# Patient Record
Sex: Male | Born: 1990 | Race: White | Hispanic: No | Marital: Married | Smoking: Former smoker
Health system: Southern US, Community
[De-identification: ages and names within clinical notes are randomized; demographics above are authoritative.]

## PROBLEM LIST (undated history)

## (undated) DIAGNOSIS — F419 Anxiety disorder, unspecified: Secondary | ICD-10-CM

## (undated) HISTORY — DX: Anxiety disorder, unspecified: F41.9

---
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
## 2011-07-12 IMAGING — CT CT ABDOMEN W/ CM
2 of 4 series · 17 of 46 positions shown, 19 images · IV contrast (APPLIED)
Comparison: None.

CT ABDOMEN

CLINICAL DATA: Right lower quadrant pain.  Normal white count

CT ABDOMEN AND PELVIS WITH CONTRAST
TECHNIQUE: Multidetector CT imaging of the abdomen and pelvis was
performed using the standard protocol during bolus administration
of intravenous contrast.

[Series 2: abd/pelv with 5.0 b31f st · axial · 0.85mm/px · z∈[-706,-272]mm · 14 of 97 slices shown, 16 images]
[im 5/97  soft-tissue]
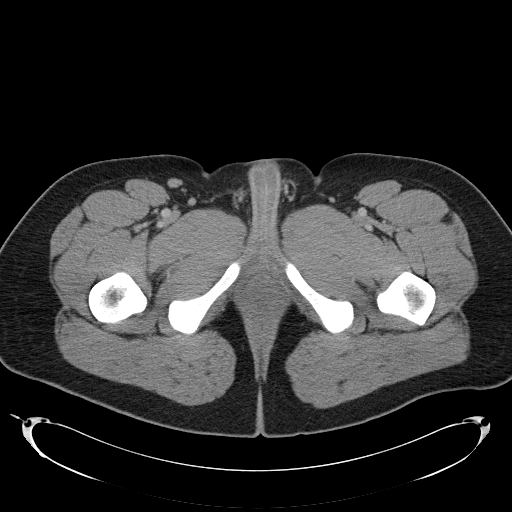
[im 5/97  bone]
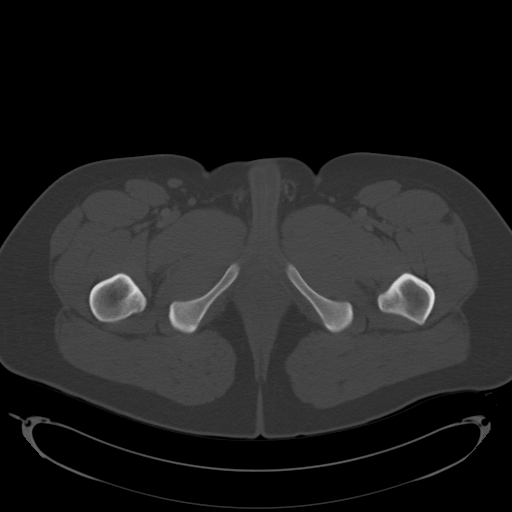
[im 13/97  soft-tissue]
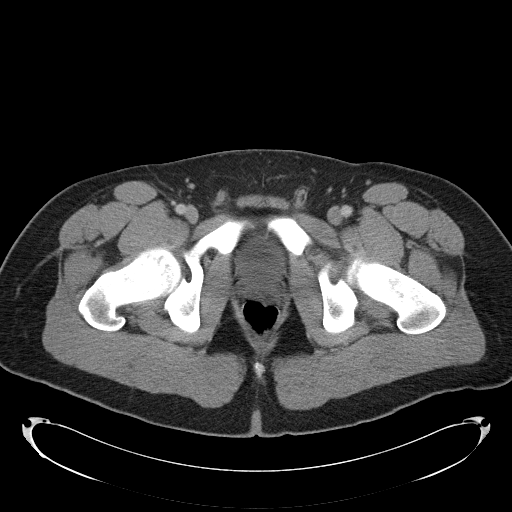
[im 17/97  soft-tissue]
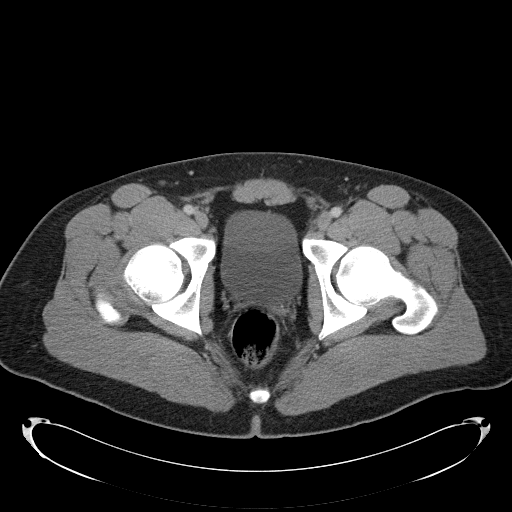
[im 26/97  soft-tissue]
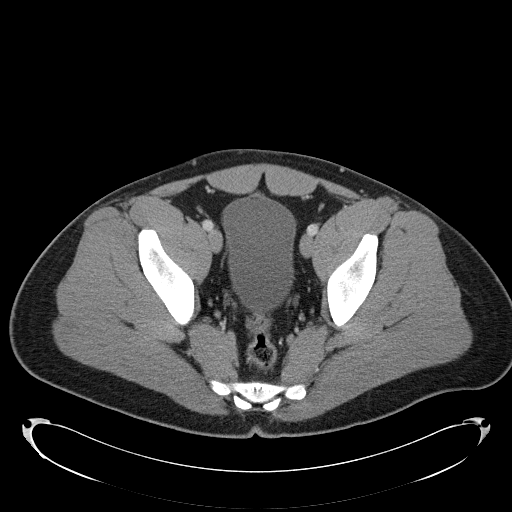
[im 34/97  soft-tissue]
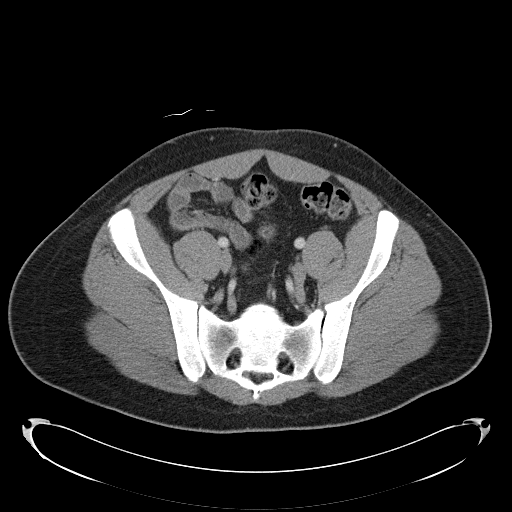
[im 38/97  soft-tissue]
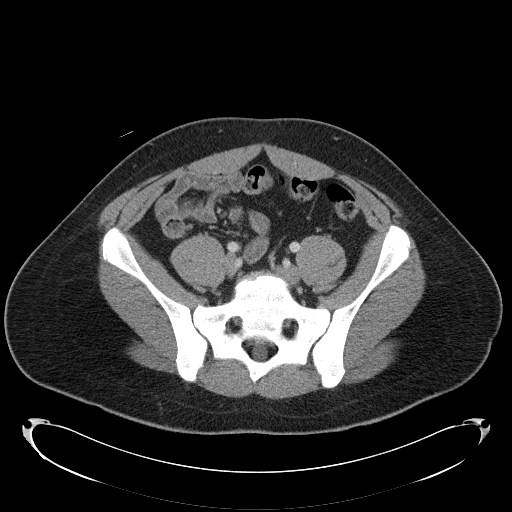
[im 46/97  soft-tissue]
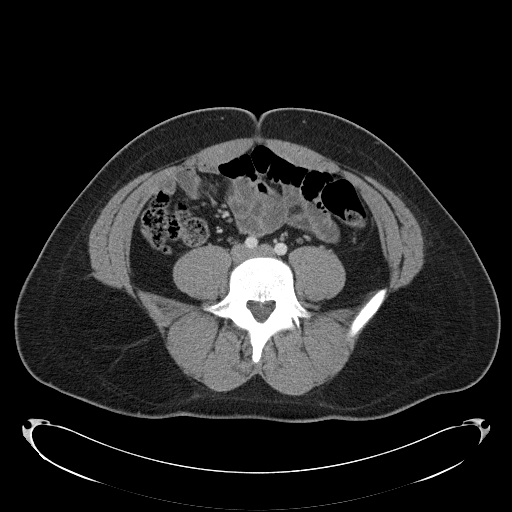
[im 51/97  soft-tissue]
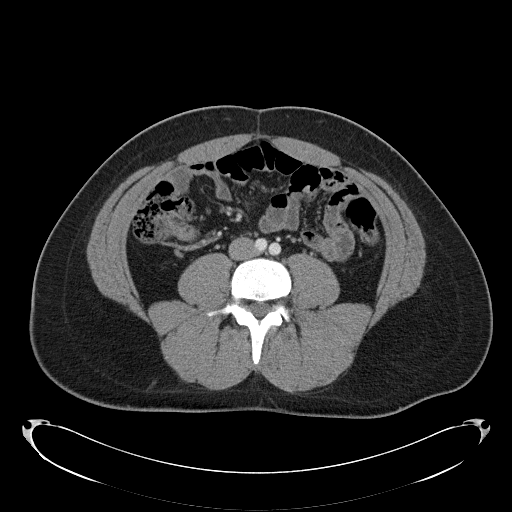
[im 59/97  soft-tissue]
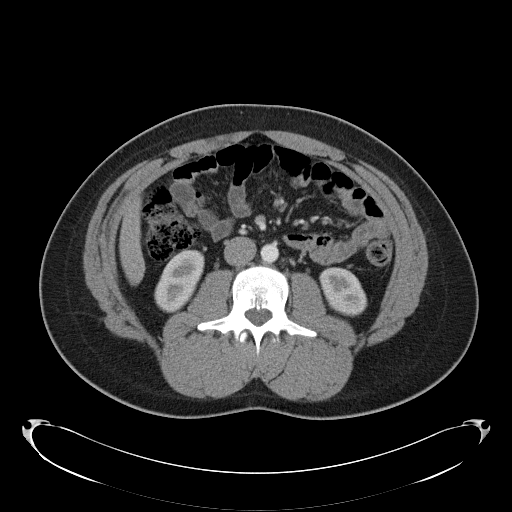
[im 59/97  bone]
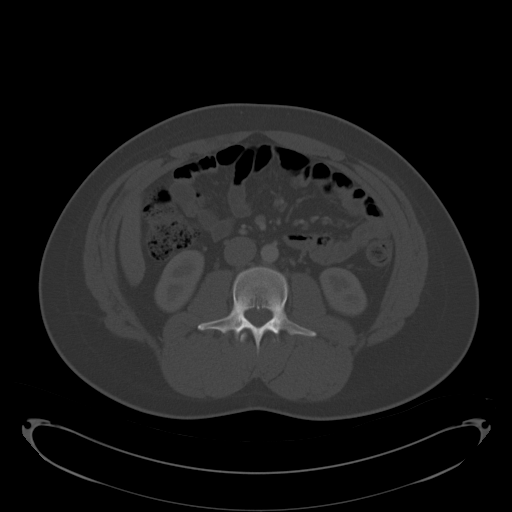
[im 63/97  soft-tissue]
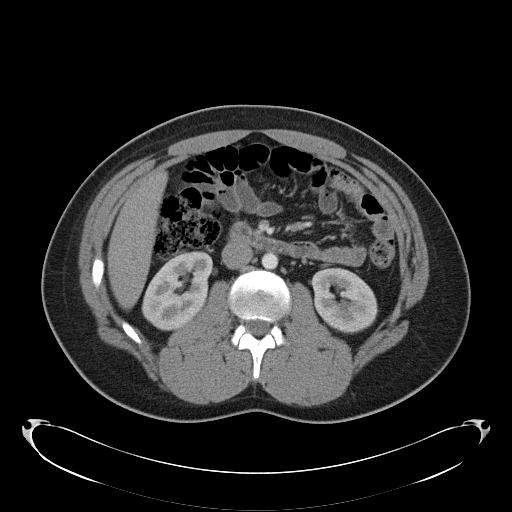
[im 71/97  soft-tissue]
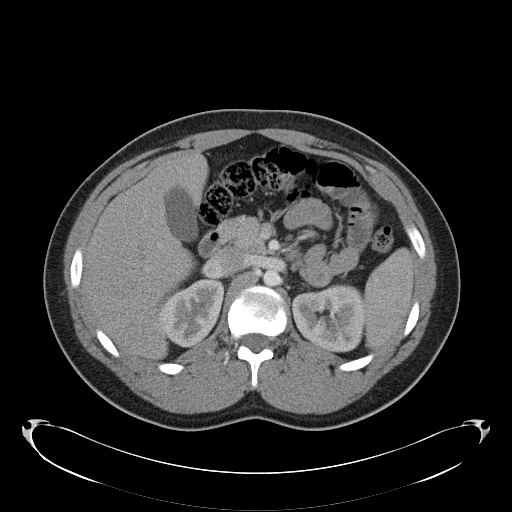
[im 80/97  soft-tissue]
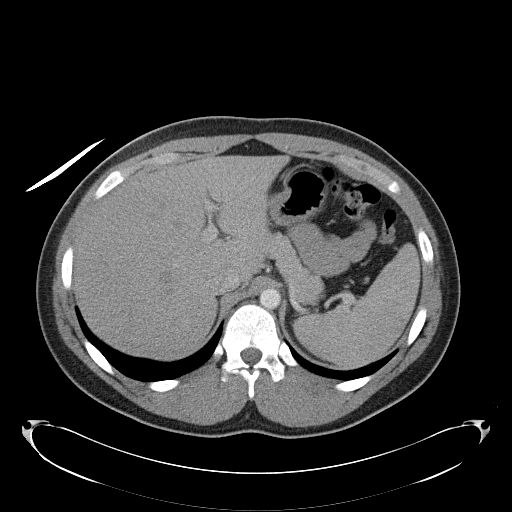
[im 84/97  soft-tissue]
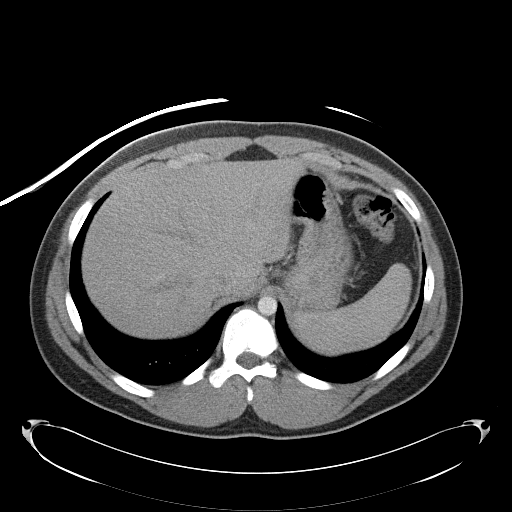
[im 92/97  soft-tissue]
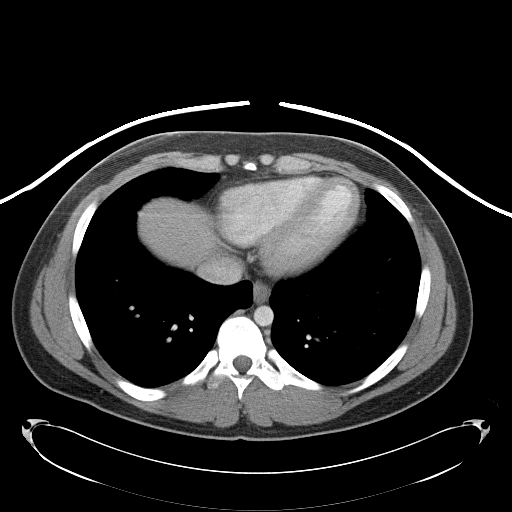

[Series 5: abd/pelv with 3.0 spo cor st · coronal · 0.95mm/px · 3 of 74 slices shown]
[im 25/74  soft-tissue]
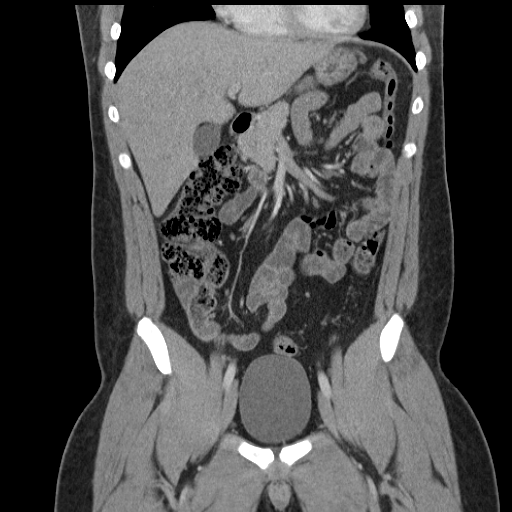
[im 33/74  soft-tissue]
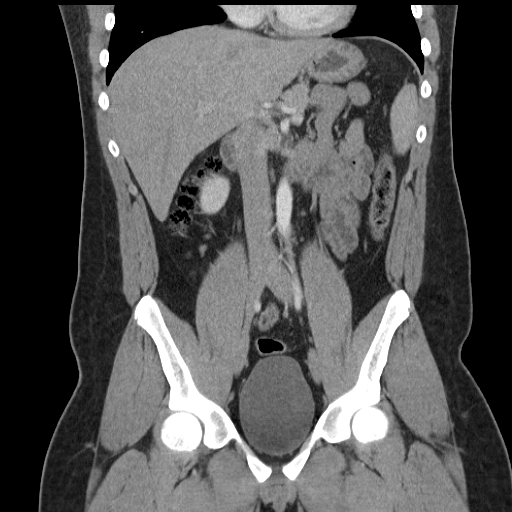
[im 41/74  soft-tissue]
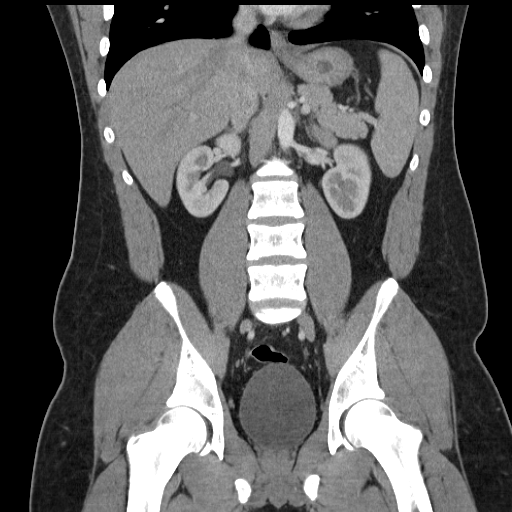

[17 of 46 positions shown; findings below may reference images not displayed]

FINDINGS: Liver, spleen, pancreas, adrenal glands, and kidneys
normal. Gallbladder unremarkable by CT. No biliary ductal dilation.
Stomach and visualized large and small bowel unremarkable.
Abdominal aorta normal in caliber. No significant lymphadenopathy.
No free fluid. Visualized lung bases clear. Moderate stool burden.
IMPRESSION: Normal CT of the abdomen.

CT PELVIS
FINDINGS: Appendix identified and normal. Visualized colon and
small bowel unremarkable. No free fluid. Pelvic organs normal for
age.  No significant lymphadenopathy. Urinary bladder normal.
Moderate stool burden.
IMPRESSION: Moderate stool burden, otherwise negative CT of the pelvis.  No
evidence for appendicitis.

## 2011-11-21 ENCOUNTER — Emergency Department (HOSPITAL_COMMUNITY)
Admission: EM | Admit: 2011-11-21 | Discharge: 2011-11-22 | Disposition: A | Discharge status: Home / Self Care | Payer: Managed Care, Other (non HMO) | Attending: Emergency Medicine | Admitting: Emergency Medicine

## 2011-11-21 ENCOUNTER — Encounter (HOSPITAL_COMMUNITY): Payer: Self-pay | Admitting: *Deleted

## 2011-11-21 DIAGNOSIS — Z87891 Personal history of nicotine dependence: Secondary | ICD-10-CM | POA: Insufficient documentation

## 2011-11-21 DIAGNOSIS — L03113 Cellulitis of right upper limb: Secondary | ICD-10-CM

## 2011-11-21 DIAGNOSIS — IMO0002 Reserved for concepts with insufficient information to code with codable children: Secondary | ICD-10-CM | POA: Insufficient documentation

## 2011-11-21 LAB — CBC WITH DIFFERENTIAL/PLATELET
Basophils Absolute: 0 10*3/uL (ref 0.0–0.1)
Eosinophils Absolute: 0.3 10*3/uL (ref 0.0–0.7)
Lymphs Abs: 2.7 10*3/uL (ref 0.7–4.0)
MCH: 29.4 pg (ref 26.0–34.0)
Neutrophils Relative %: 38 % — ABNORMAL LOW (ref 43–77)
Platelets: 202 10*3/uL (ref 150–400)
RBC: 4.89 MIL/uL (ref 4.22–5.81)
RDW: 12.8 % (ref 11.5–15.5)
WBC: 5.8 10*3/uL (ref 4.0–10.5)

## 2011-11-21 LAB — BASIC METABOLIC PANEL
Calcium: 9.5 mg/dL (ref 8.4–10.5)
GFR calc Af Amer: >90 mL/min (ref >90)
GFR calc non Af Amer: 90 mL/min — ABNORMAL LOW (ref >90)
Glucose, Bld: 91 mg/dL (ref 70–99)
Potassium: 4 mEq/L (ref 3.5–5.1)
Sodium: 138 mEq/L (ref 135–145)

## 2011-11-21 MED ORDER — CLINDAMYCIN PHOSPHATE 900 MG/50ML IV SOLN
900.0000 mg | Freq: Once | INTRAVENOUS | Status: AC
  Administered 2011-11-21: 900 mg via INTRAVENOUS
  Filled 2011-11-21: qty 50

## 2011-11-21 MED ORDER — SODIUM CHLORIDE 0.9 % IV BOLUS (SEPSIS)
1000.0000 mL | Freq: Once | INTRAVENOUS | Status: AC
  Administered 2011-11-21: 1000 mL via INTRAVENOUS

## 2011-11-21 NOTE — ED Notes (Signed)
Peter, PA at bedside.

## 2011-11-21 NOTE — ED Notes (Signed)
Pt presents with an infection to his right forearm. Pt states [employer] doctor dx it as a staph infection. Pt was put on abx. Pt is having no relief, and reports infection is spreading. Pt reports n/d. Denies nausea

## 2011-11-21 NOTE — ED Provider Notes (Signed)
History     CSN: [id number]  Arrival date & time 11/21/11  1825   First MD Initiated Contact with Patient 11/21/11 2250      Chief Complaint  Patient presents with  . Cellulitis   HPI  History provided by the patient. Patient is a 21 year old male with no significant PMH who presents with concerns of skin infection to right forearm the past several days. 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 Patient was told he had a staph infection and placed on Bactrim. He has been taking this for the past few days but reports having increasing redness and now increased tenderness to the skin area. Patient also reports feeling increased fatigue and weakness with decreased appetite. He denies having any fever, chills or sweats. Denies any episodes of vomiting but does report several loose bowel movements. He denies any abdominal pain.     History reviewed. No pertinent past medical history.  History reviewed. No pertinent past surgical history.  History reviewed. No pertinent family history.  History  Substance Use Topics  . Smoking status: Former Games developer  . Smokeless tobacco: Current User    Types: Chew  . Alcohol Use: Yes      Review of Systems  Constitutional: Positive for appetite change, fatigue and unexpected weight change. Negative for fever and chills.  Respiratory: Negative for cough.   Gastrointestinal: Positive for nausea and diarrhea. Negative for vomiting and abdominal pain.  Neurological: Negative for headaches.    Allergies  Review of patient's allergies indicates no known allergies.  Home Medications   Current Outpatient Rx  Name Route Sig Dispense Refill  . LISDEXAMFETAMINE DIMESYLATE 60 MG PO CAPS Oral Take 60 mg by mouth every morning.    . SULFAMETHOXAZOLE-TRIMETHOPRIM 800-160 MG PO TABS Oral Take 1 tablet by mouth 2 (two) times daily.      BP 130/77   Pulse 64  Temp 97.6 F (36.4 C) (Oral)  Resp 18  Ht 6\' 2"  (1.88 m)  Wt 272 lb 3.2 oz (123.469 kg)  BMI 34.95 kg/m2  SpO2 100%  Physical Exam  Nursing note and vitals reviewed. Constitutional: He appears well-developed and well-nourished. No distress.  HENT:  Head: Normocephalic.  Neck: Normal range of motion. Neck supple.       No meningeal signs  Cardiovascular: Normal rate and regular rhythm.   Pulmonary/Chest: Effort normal and breath sounds normal. No respiratory distress. He has no wheezes. He has no rales.  Musculoskeletal:       Arms:      3 distinct small ulcerative type lesions to anterior right forearm with surrounding erythema and mild induration. No erythematous streaks up the arm. There is mild tenderness.  Neurological: He is alert.  Skin: Skin is warm. No rash noted.       See extremity exam   Psychiatric: He has a normal mood and affect. His behavior is normal.    ED Course  Procedures   Results for orders placed during the hospital encounter of 11/21/11  CBC WITH DIFFERENTIAL      Component Value Range   WBC 5.8  4.0 - 10.5 K/uL   RBC 4.89  4.22 - 5.81 MIL/uL   Hemoglobin 14.4  13.0 - 17.0 g/dL   HCT 40.9  81.1 - 91.4 %   MCV 85.3  78.0 - 100.0 fL   MCH 29.4  26.0 -  34.0 pg   MCHC 34.5  30.0 - 36.0 g/dL   RDW 16.1  09.6 - 04.5 %   Platelets 202  150 - 400 K/uL   Neutrophils Relative 38 (*) 43 - 77 %   Neutro Abs 2.2  1.7 - 7.7 K/uL   Lymphocytes Relative 46  12 - 46 %   Lymphs Abs 2.7  0.7 - 4.0 K/uL   Monocytes Relative 11  3 - 12 %   Monocytes Absolute 0.6  0.1 - 1.0 K/uL   Eosinophils Relative 4  0 - 5 %   Eosinophils Absolute 0.3  0.0 - 0.7 K/uL   Basophils Relative 1  0 - 1 %   Basophils Absolute 0.0  0.0 - 0.1 K/uL  BASIC METABOLIC PANEL      Component Value Range   Sodium 138  135 - 145 mEq/L   Potassium 4.0  3.5 - 5.1 mEq/L   Chloride 102  96 - 112 mEq/L   CO2 26  19 - 32 mEq/L   Glucose, Bld 91  70 - 99 mg/dL   BUN 13  6 - 23 mg/dL    Creatinine, Ser 4.09  0.50 - 1.35 mg/dL   Calcium 9.5  8.4 - 81.1 mg/dL   GFR calc non Af Amer 90 (*) >90 mL/min   GFR calc Af Amer >90  >90 mL/min        1. Cellulitis of forearm, right       MDM  10:50 PM patient seen and evaluated. Patient well-appearing nontoxic.   Patient continues to do well. Skin marking apply to area of erythema. Patient given dose of clindamycin IV. At this time we'll discharged with prescription of same. He will followup with team doctor for continued evaluation and treatment.     Angus Seller, [place] 11/22/11 [phone]

## 2011-11-22 MED ORDER — CLINDAMYCIN HCL 300 MG PO CAPS: 300.0000 mg | ORAL_CAPSULE | Freq: Four times a day (QID) | ORAL | Status: DC

## 2011-11-22 MED ORDER — MUPIROCIN 2 % EX OINT: TOPICAL_OINTMENT | Freq: Three times a day (TID) | CUTANEOUS | Status: DC

## 2011-11-22 NOTE — ED Provider Notes (Signed)
Medical screening examination/treatment/procedure(s) were performed by non-physician practitioner and as supervising physician I was immediately available for consultation/collaboration.  Jones Skene, M.D.     Jones Skene, MD 11/22/11 [phone]

## 2012-05-19 ENCOUNTER — Emergency Department (HOSPITAL_COMMUNITY)
Admission: EM | Admit: 2012-05-19 | Discharge: 2012-05-19 | Disposition: A | Discharge status: Home / Self Care | Payer: Managed Care, Other (non HMO) | Attending: Emergency Medicine | Admitting: Emergency Medicine

## 2012-05-19 ENCOUNTER — Encounter (HOSPITAL_COMMUNITY): Payer: Self-pay | Admitting: Emergency Medicine

## 2012-05-19 ENCOUNTER — Emergency Department (HOSPITAL_COMMUNITY): Payer: Managed Care, Other (non HMO)

## 2012-05-19 DIAGNOSIS — R609 Edema, unspecified: Secondary | ICD-10-CM | POA: Insufficient documentation

## 2012-05-19 DIAGNOSIS — R0982 Postnasal drip: Secondary | ICD-10-CM | POA: Insufficient documentation

## 2012-05-19 DIAGNOSIS — R059 Cough, unspecified: Secondary | ICD-10-CM | POA: Insufficient documentation

## 2012-05-19 DIAGNOSIS — R110 Nausea: Secondary | ICD-10-CM | POA: Insufficient documentation

## 2012-05-19 DIAGNOSIS — R509 Fever, unspecified: Secondary | ICD-10-CM | POA: Insufficient documentation

## 2012-05-19 DIAGNOSIS — Z87891 Personal history of nicotine dependence: Secondary | ICD-10-CM | POA: Insufficient documentation

## 2012-05-19 DIAGNOSIS — J029 Acute pharyngitis, unspecified: Secondary | ICD-10-CM | POA: Insufficient documentation

## 2012-05-19 DIAGNOSIS — R52 Pain, unspecified: Secondary | ICD-10-CM | POA: Insufficient documentation

## 2012-05-19 DIAGNOSIS — J3489 Other specified disorders of nose and nasal sinuses: Secondary | ICD-10-CM | POA: Insufficient documentation

## 2012-05-19 DIAGNOSIS — Z79899 Other long term (current) drug therapy: Secondary | ICD-10-CM | POA: Insufficient documentation

## 2012-05-19 DIAGNOSIS — R0602 Shortness of breath: Secondary | ICD-10-CM | POA: Insufficient documentation

## 2012-05-19 DIAGNOSIS — J069 Acute upper respiratory infection, unspecified: Secondary | ICD-10-CM | POA: Insufficient documentation

## 2012-05-19 LAB — RAPID STREP SCREEN (MED CTR MEBANE ONLY): Streptococcus, Group A Screen (Direct): NEGATIVE

## 2012-05-19 MED ORDER — GUAIFENESIN ER 600 MG PO TB12: 1200.0000 mg | ORAL_TABLET | Freq: Two times a day (BID) | ORAL | Status: DC

## 2012-05-19 MED ORDER — ALBUTEROL SULFATE HFA 108 (90 BASE) MCG/ACT IN AERS
2.0000 | INHALATION_SPRAY | Freq: Four times a day (QID) | RESPIRATORY_TRACT | Status: DC | PRN
  Administered 2012-05-19: 2 via RESPIRATORY_TRACT
  Filled 2012-05-19: qty 6.7

## 2012-05-19 MED ORDER — IPRATROPIUM BROMIDE 0.03 % NA SOLN: 2.0000 | Freq: Two times a day (BID) | NASAL | Status: DC

## 2012-05-19 MED ORDER — HYDROCODONE-HOMATROPINE 5-1.5 MG/5ML PO SYRP: 5.0000 mL | ORAL_SOLUTION | Freq: Four times a day (QID) | ORAL | Status: DC | PRN

## 2012-05-19 NOTE — ED Notes (Signed)
Pt denies any questions upon discharge. 

## 2012-05-19 NOTE — ED Provider Notes (Signed)
History  This chart was scribed for non-physician practitioner Dierdre Forth, PA-C working with Vida Roller, MD, by Candelaria Stagers, ED Scribe. This patient was seen in room TR10C/TR10C and the patient's care was started at 10:36 PM   CSN: [id number]  Arrival date & time 05/19/12  2010   First MD Initiated Contact with Patient 05/19/12 2128      Chief Complaint  Patient presents with  . Cough    The history is provided by the patient. No language interpreter was used.   Alex Rivas is a 22 y.o. male who presents to the Emergency Department complaining of cough and sore throat that started about three weeks ago, subsided, and then worsened two days ago.  Pt is also experiencing SOB, fever, body aches, sinus pressure, and drainage down the back of his throat.  He denies vomiting or diarrhea.  Pt did have a flu shot this year.  Pt lives in a dorm.  He has taken Dayquil and Mucinex with no relief.    History reviewed. No pertinent past medical history.  History reviewed. No pertinent past surgical history.  No family history on file.  History  Substance Use Topics  . Smoking status: Former Games developer  . Smokeless tobacco: Current User    Types: Chew  . Alcohol Use: Yes      Review of Systems  Constitutional: Positive for fever. Negative for chills.  HENT: Positive for postnasal drip and sinus pressure.   Gastrointestinal: Positive for nausea. Negative for vomiting and diarrhea.  All other systems reviewed and are negative.    Allergies  Review of patient's allergies indicates no known allergies.  Home Medications   Current Outpatient Rx  Name  Route  Sig  Dispense  Refill  . guaiFENesin (MUCINEX) 600 MG 12 hr tablet   Oral   Take 2 tablets (1,200 mg total) by mouth 2 (two) times daily.   30 tablet   0   . HYDROcodone-homatropine (HYCODAN) 5-1.5 MG/5ML syrup   Oral   Take 5 mLs by mouth every 6 (six) hours as needed for cough.   120 mL   0   .  ipratropium (ATROVENT) 0.03 % nasal spray   Nasal   Place 2 sprays into the nose every 12 (twelve) hours.   30 mL   12   . lisdexamfetamine (VYVANSE) 60 MG capsule   Oral   Take 60 mg by mouth every morning.           BP 132/76  Pulse 102  Temp(Src) 98.3 F (36.8 C) (Oral)  Resp 22  SpO2 98%  Physical Exam  Nursing note and vitals reviewed. Constitutional: He is oriented to person, place, and time. He appears well-developed and well-nourished. No distress.  HENT:  Head: Normocephalic and atraumatic.  Right Ear: Tympanic membrane, external ear and ear canal normal. Tympanic membrane is not erythematous and not bulging.  Left Ear: Tympanic membrane, external ear and ear canal normal. Tympanic membrane is not erythematous and not bulging.  Nose: Mucosal edema and rhinorrhea present. Right sinus exhibits no maxillary sinus tenderness and no frontal sinus tenderness. Left sinus exhibits no maxillary sinus tenderness and no frontal sinus tenderness.  Mouth/Throat: Uvula is midline, oropharynx is clear and moist and mucous membranes are normal. Mucous membranes are not dry and not cyanotic. No edematous. No oropharyngeal exudate, posterior oropharyngeal edema, posterior oropharyngeal erythema or tonsillar abscesses.  Eyes: Conjunctivae are normal. Pupils are equal, round, and reactive to light. No  scleral icterus.  Neck: Normal range of motion. Neck supple.  Cardiovascular: Regular rhythm, normal heart sounds and intact distal pulses.  Exam reveals no gallop and no friction rub.   No murmur heard. Mildly tachycardic.   Pulmonary/Chest: Effort normal and breath sounds normal. No respiratory distress. He has no wheezes. He has no rales.  Abdominal: Soft. Bowel sounds are normal. He exhibits no mass. There is no tenderness. There is no rebound and no guarding.  Musculoskeletal: Normal range of motion. He exhibits no edema.  Lymphadenopathy:    He has no cervical adenopathy.   Neurological: He is alert and oriented to person, place, and time. He exhibits normal muscle tone. Coordination normal.  Speech is clear and goal oriented Moves extremities without ataxia  Skin: Skin is warm and dry. No rash noted. He is not diaphoretic. No erythema.  Psychiatric: He has a normal mood and affect.    ED Course  Procedures   DIAGNOSTIC STUDIES: Oxygen Saturation is 98% on room air, normal by my interpretation.    COORDINATION OF CARE:  10:38 PM Discussed course of care with pt which includes treating symptoms.  Pt understands and agrees.    Labs Reviewed  RAPID STREP SCREEN   Dg Chest 2 View  05/19/2012  *RADIOLOGY REPORT*  Clinical Data: Congestion and shortness of breath for a month. Increasing over 2 days.  CHEST - 2 VIEW  Comparison: None.  Findings: Shallow inspiration. The heart size and pulmonary vascularity are normal. The lungs appear clear and expanded without focal air space disease or consolidation. No blunting of the costophrenic angles.  No pneumothorax.  Mediastinal contours appear intact.  IMPRESSION: No evidence of active pulmonary disease.   Original Report Authenticated By: Burman Nieves, M.D.      1. Viral URI with cough       MDM  Alex Rivas presents for URI symptoms.  Pt CXR negative for acute infiltrate. Patients symptoms are consistent with URI, likely viral etiology. Discussed that antibiotics are not indicated for viral infections. Pt will be discharged with symptomatic treatment.  Verbalizes understanding and is agreeable with plan. Pt is hemodynamically stable & in NAD prior to dc.  1. Medications: atrovent NS, mucinex, hycodan, usual home medications 2. Treatment: rest, drink plenty of fluids, take tylenol or ibuprofen for fever control 3. Follow Up: Please followup with your primary doctor for discussion of your diagnoses and further evaluation after today's visit; if you do not have a primary care doctor use the resource guide  provided to find one;   I personally performed the services described in this documentation, which was scribed in my presence. The recorded information has been reviewed and is accurate.   Dahlia Client Polo Mcmartin, PA-C 05/19/12 2306  Dierdre Forth, PA-C 05/19/12 2306

## 2012-05-19 NOTE — ED Notes (Signed)
Pt st's he had a cough 3 weeks ago then it got better st's cough returned 2 days ago with sore throat

## 2012-05-19 NOTE — ED Provider Notes (Signed)
Medical screening examination/treatment/procedure(s) were performed by non-physician practitioner and as supervising physician I was immediately available for consultation/collaboration.    Mahoganie Basher D Deanna Wiater, MD 05/19/12 2349 

## 2014-11-30 IMAGING — CR DG CHEST 2V
2 series · 2 of 2 positions shown · non-contrast
Comparison: None.

CLINICAL DATA: Congestion and shortness of breath for a month.
Increasing over 2 days.

CHEST - 2 VIEW

[w chest pa]
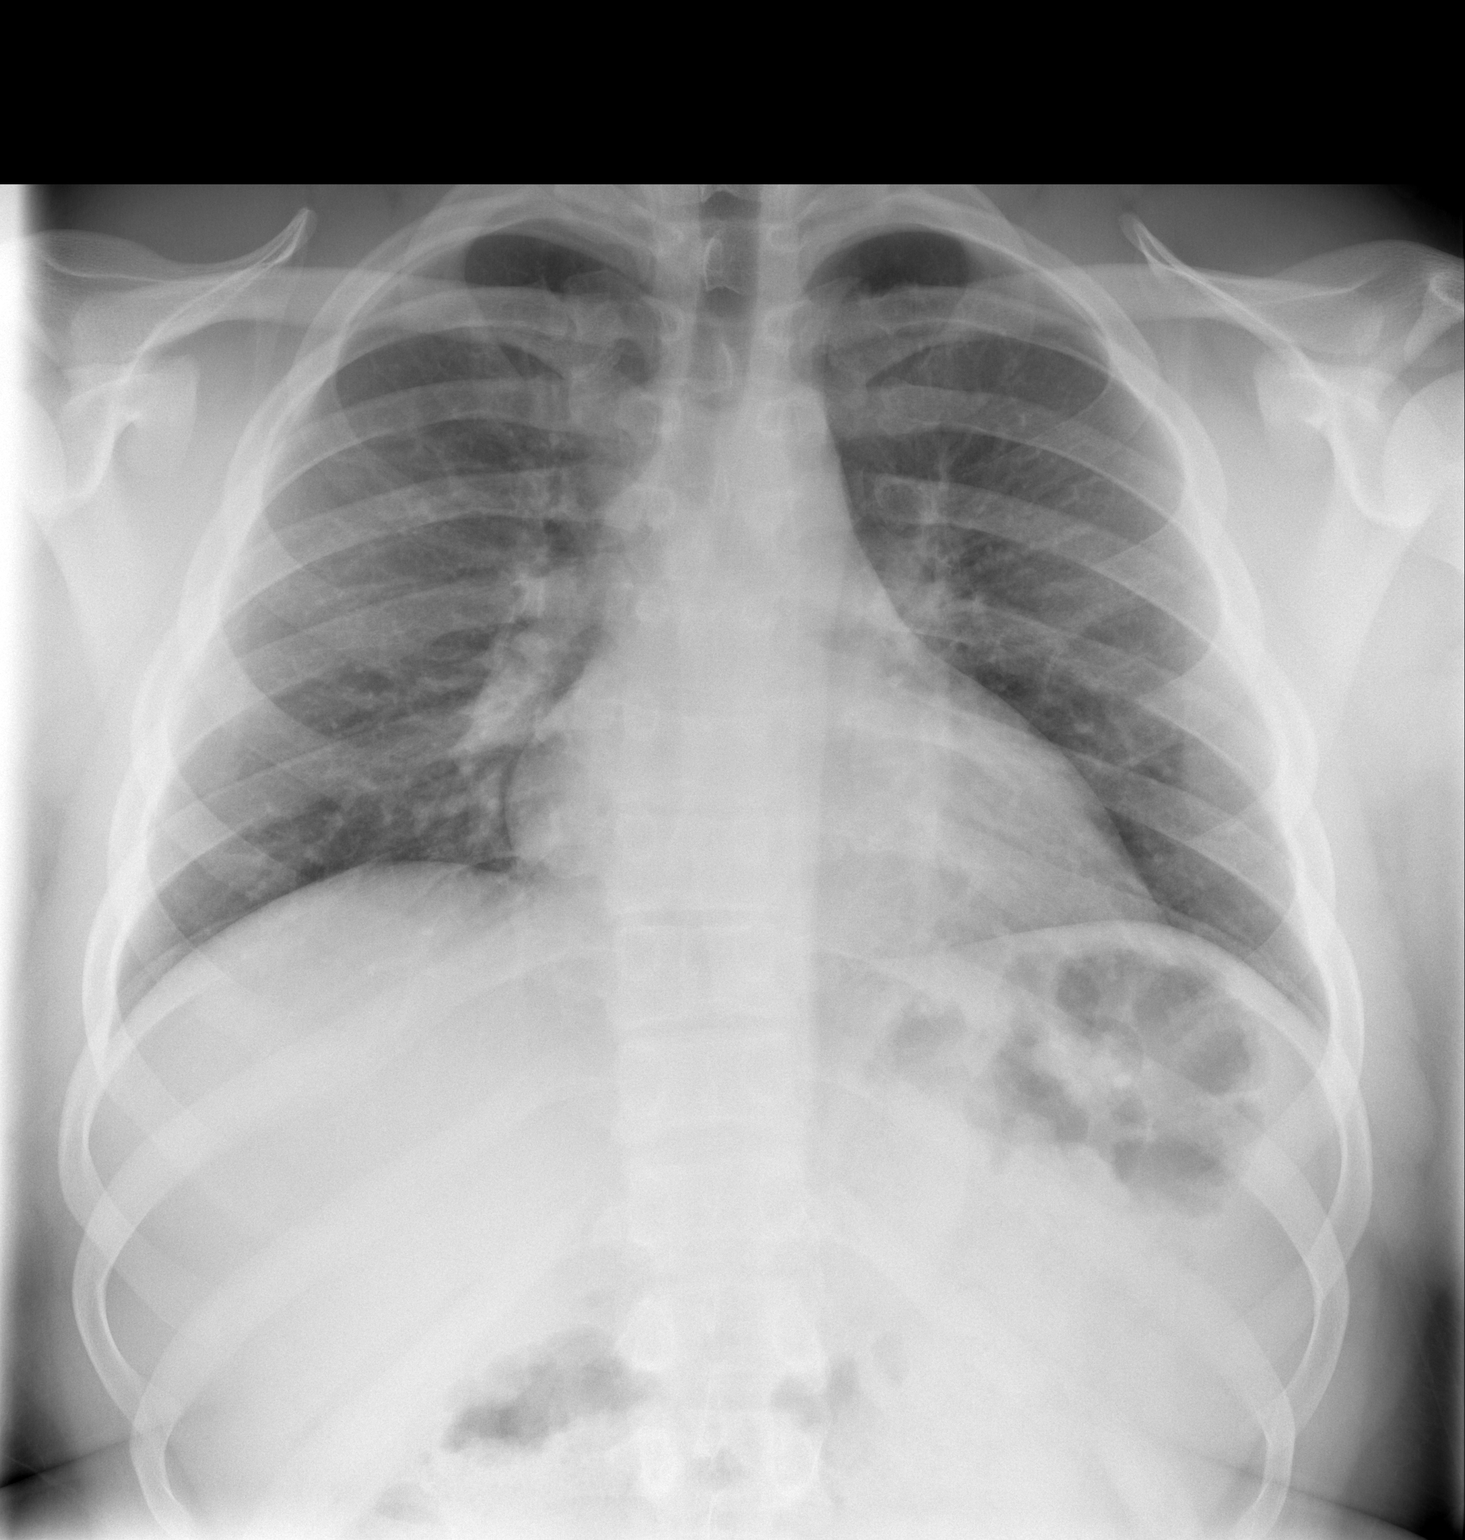

[w chest lat]
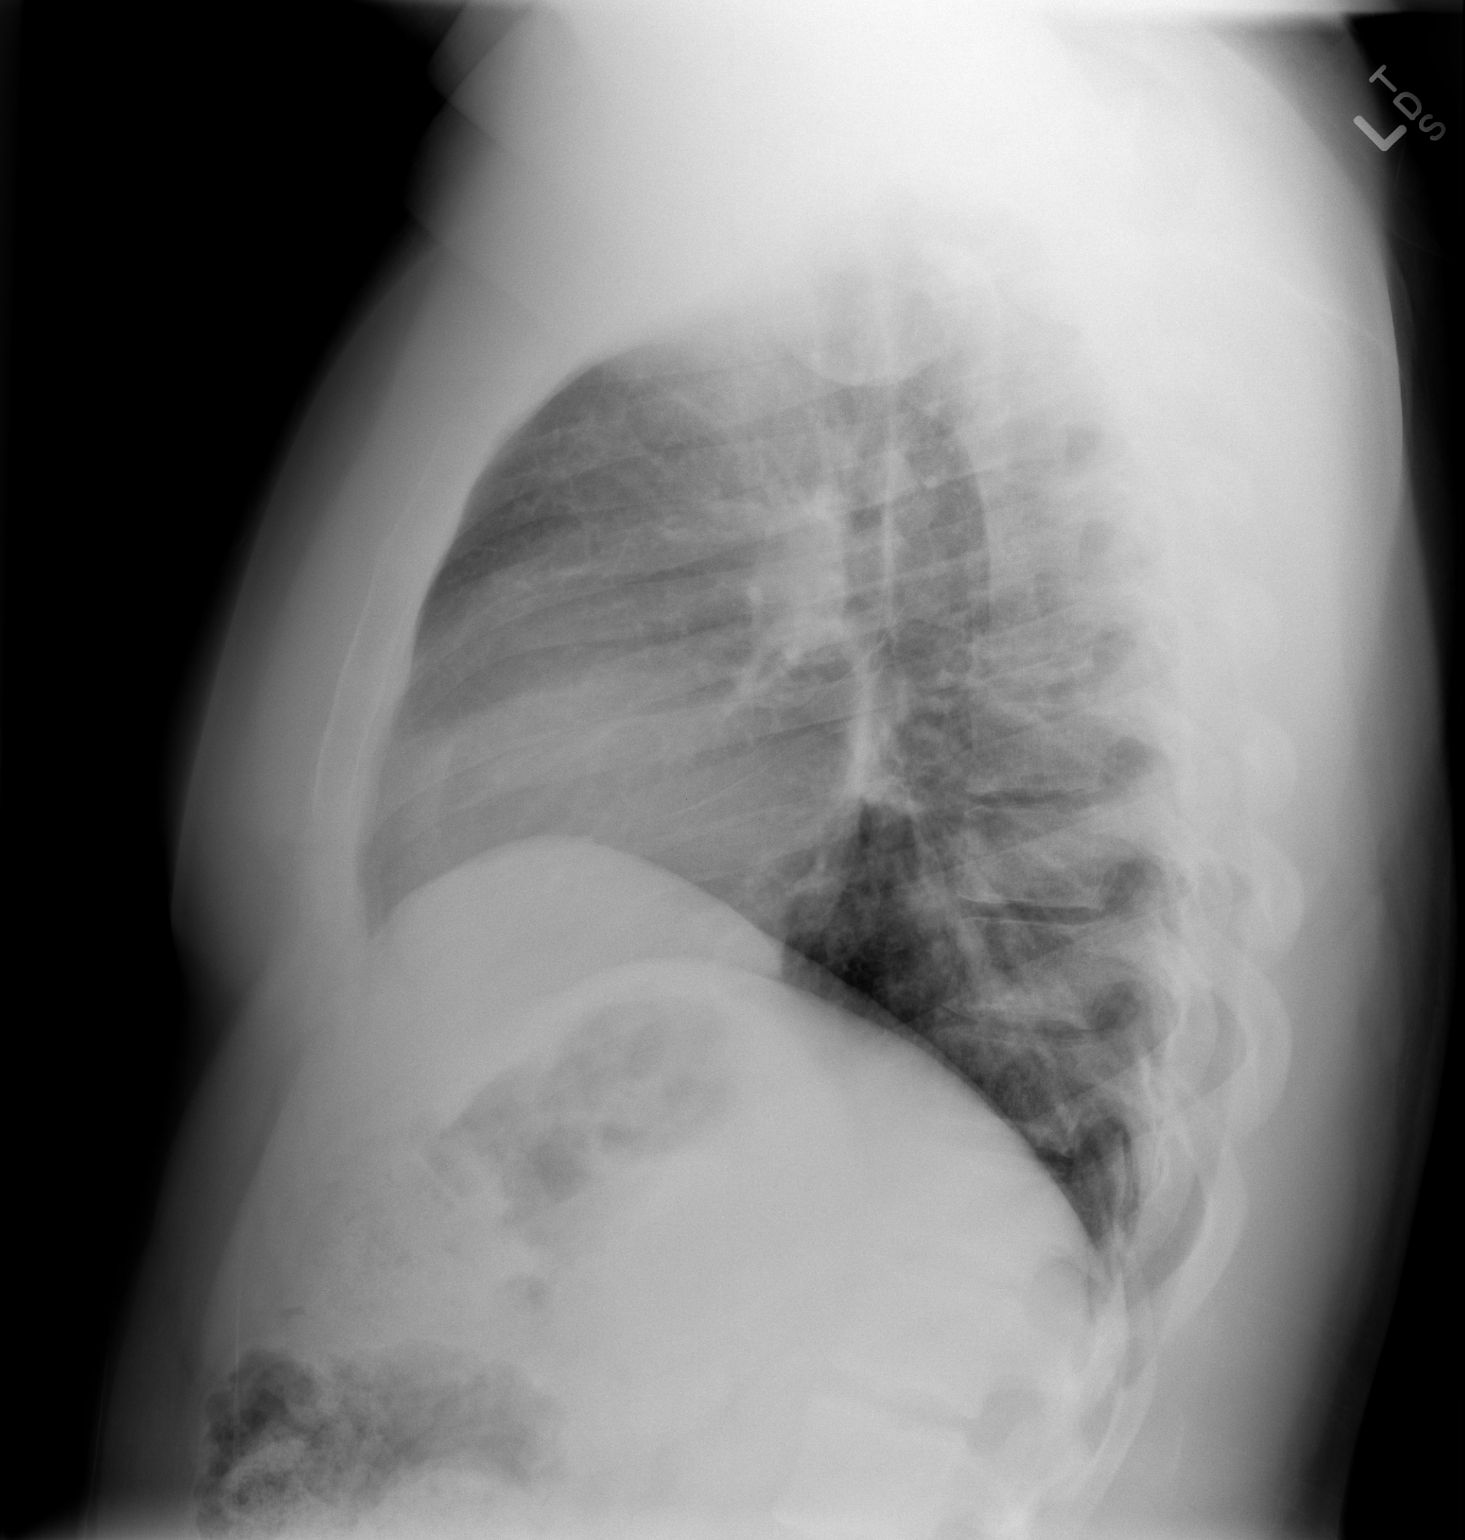

[2 of 2 positions shown; findings below may reference images not displayed]

FINDINGS: Shallow inspiration. The heart size and pulmonary
vascularity are normal. The lungs appear clear and expanded without
focal air space disease or consolidation. No blunting of the
costophrenic angles.  No pneumothorax.  Mediastinal contours appear
intact.
IMPRESSION: No evidence of active pulmonary disease.

## 2021-11-23 DIAGNOSIS — M5136 Other intervertebral disc degeneration, lumbar region: Secondary | ICD-10-CM | POA: Diagnosis not present

## 2021-11-26 DIAGNOSIS — M545 Low back pain, unspecified: Secondary | ICD-10-CM | POA: Diagnosis not present

## 2022-06-07 ENCOUNTER — Ambulatory Visit: Payer: Managed Care, Other (non HMO) | Admitting: Nurse Practitioner

## 2022-07-07 ENCOUNTER — Encounter: Payer: Self-pay | Admitting: Nurse Practitioner

## 2022-07-07 ENCOUNTER — Ambulatory Visit: Payer: BC Managed Care – PPO | Admitting: Nurse Practitioner

## 2022-07-07 VITALS — BP 131/85 | HR 71 | Temp 97.8°F | Ht 74.0 in | Wt 277.0 lb

## 2022-07-07 DIAGNOSIS — F411 Generalized anxiety disorder: Secondary | ICD-10-CM | POA: Insufficient documentation

## 2022-07-07 DIAGNOSIS — E7801 Familial hypercholesterolemia: Secondary | ICD-10-CM | POA: Diagnosis not present

## 2022-07-07 DIAGNOSIS — Z0000 Encounter for general adult medical examination without abnormal findings: Secondary | ICD-10-CM | POA: Diagnosis not present

## 2022-07-07 DIAGNOSIS — Z1329 Encounter for screening for other suspected endocrine disorder: Secondary | ICD-10-CM

## 2022-07-07 DIAGNOSIS — Z23 Encounter for immunization: Secondary | ICD-10-CM | POA: Diagnosis not present

## 2022-07-07 DIAGNOSIS — F419 Anxiety disorder, unspecified: Secondary | ICD-10-CM | POA: Diagnosis not present

## 2022-07-07 HISTORY — DX: Encounter for general adult medical examination without abnormal findings: Z00.00

## 2022-07-07 NOTE — Assessment & Plan Note (Signed)
Physical exam complete. Lab work as outlined, patient will return to complete this when fasting. Flu vaccine- UTD. Tetanus vaccine- today! Declined additional COVID vaccines. Declined HIV and Hep C screenings. Recommended establishing with Ophthalmology for annual exam and follow up with Dentist as scheduled. Encouraged to continue healthy diet and exercise, discussed decreasing caffeine intake. Return to care in one year, sooner PRN.

## 2022-07-07 NOTE — Assessment & Plan Note (Signed)
Chronic. Stable without medication. Symptoms well managed at this time. GAD-9. Encouraged to contact if symptoms are worsening or changing. Will monitor.

## 2022-07-07 NOTE — Progress Notes (Signed)
Bethanie Dicker, NP-C Phone: [phone]  Alex Rivas is a 32 y.o. male who presents today to establish care and for annual exam. He has no complaints or new concerns today. He is not on any medications. He will return for fasting lab work.   Diet: Well rounded- increased protein, increased caffeine Exercise: Gym- weights and cardio 5 times a week Family history-  Prostate cancer: No  Colon cancer: No Sexually active: Yes Vaccines-   Flu: 2023- UTD  Tetanus: Unsure- wife recently had baby, needs!  COVID19: x 2 HIV screening: Declined Hep C Screening: Declined Tobacco use: No Alcohol use: Yes- once a week Illicit Drug use: No Dentist: Yes- appointment this morning Ophthalmology: No- needs to establish with one   Active Ambulatory Problems    Diagnosis Date Noted   Preventative health care 07/07/2022   Familial hypercholesterolemia 07/07/2022   Anxiety 07/07/2022   Resolved Ambulatory Problems    Diagnosis Date Noted   No Resolved Ambulatory Problems   No Additional Past Medical History    Family History  Problem Relation Age of Onset   ADD / ADHD Mother    Depression Mother    Heart disease Paternal Grandfather     Social History   Socioeconomic History   Marital status: Married    Spouse name: Not on file   Number of children: Not on file   Years of education: Not on file   Highest education level: Not on file  Occupational History   Not on file  Tobacco Use   Smoking status: Former    Types: Cigarettes   Smokeless tobacco: Current    Types: Chew  Substance and Sexual Activity   Alcohol use: Yes    Alcohol/week: 1.0 standard drink of alcohol    Types: 1 Standard drinks or equivalent per week   Drug use: No   Sexual activity: Yes    Birth control/protection: None    Comment: Married  Other Topics Concern   Not on file  Social History Narrative   Not on file   Social Determinants of Health   Financial Resource Strain: Not on file  Food  Insecurity: Not on file  Transportation Needs: Not on file  Physical Activity: Not on file  Stress: Not on file  Social Connections: Not on file  Intimate Partner Violence: Not on file    ROS  General:  Negative for unexplained weight loss, fever Skin: Negative for new or changing mole, sore that won't heal HEENT: Negative for trouble hearing, trouble seeing, ringing in ears, mouth sores, hoarseness, change in voice, dysphagia. CV:  Negative for chest pain, dyspnea, edema, palpitations Resp: Negative for cough, dyspnea, hemoptysis GI: Negative for nausea, vomiting, diarrhea, constipation, abdominal pain, melena, hematochezia. GU: Negative for dysuria, incontinence, urinary hesitance, hematuria, vaginal or penile discharge, polyuria, sexual difficulty, lumps in testicle or breasts MSK: Negative for muscle cramps or aches, joint pain or swelling Neuro: Negative for headaches, weakness, numbness, dizziness, passing out/fainting Psych: Negative for depression, memory problems  Objective  Physical Exam Vitals:   07/07/22 1307 07/07/22 1336  BP: (!) 142/98 131/85  Pulse: 71   Temp: 97.8 F (36.6 C)   SpO2: 99%     BP Readings from Last 3 Encounters:  07/07/22 131/85  05/19/12 126/67  11/22/11 118/66   Wt Readings from Last 3 Encounters:  07/07/22 277 lb (125.6 kg)  11/21/11 272 lb 3.2 oz (123.5 kg)    Physical Exam Constitutional:      General:  He is not in acute distress.    Appearance: Normal appearance.  HENT:     Head: Normocephalic.     Right Ear: Tympanic membrane normal.     Left Ear: Tympanic membrane normal.     Nose: Nose normal.     Mouth/Throat:     Mouth: Mucous membranes are moist.     Pharynx: Oropharynx is clear.  Eyes:     Conjunctiva/sclera: Conjunctivae normal.     Pupils: Pupils are equal, round, and reactive to light.  Neck:     Thyroid: No thyromegaly.  Cardiovascular:     Rate and Rhythm: Normal rate and regular rhythm.     Heart sounds:  Normal heart sounds.  Pulmonary:     Effort: Pulmonary effort is normal.     Breath sounds: Normal breath sounds.  Abdominal:     General: Abdomen is flat. Bowel sounds are normal.     Palpations: Abdomen is soft. There is no mass.     Tenderness: There is no abdominal tenderness.  Musculoskeletal:        General: Normal range of motion.  Lymphadenopathy:     Cervical: No cervical adenopathy.  Skin:    General: Skin is warm and dry.     Findings: No rash.  Neurological:     General: No focal deficit present.     Mental Status: He is alert.  Psychiatric:        Mood and Affect: Mood normal.        Behavior: Behavior normal.    Assessment/Plan:   Preventative health care Assessment & Plan: Physical exam complete. Lab work as outlined, patient will return to complete this when fasting. Flu vaccine- UTD. Tetanus vaccine- today! Declined additional COVID vaccines. Declined HIV and Hep C screenings. Recommended establishing with Ophthalmology for annual exam and follow up with Dentist as scheduled. Encouraged to continue healthy diet and exercise, discussed decreasing caffeine intake. Return to care in one year, sooner PRN.   Orders: -     Hemoglobin A1c; Future -     VITAMIN D 25 Hydroxy (Vit-D Deficiency, Fractures); Future -     Vitamin B12; Future -     CBC with Differential/Platelet; Future -     Comprehensive metabolic panel; Future  Familial hypercholesterolemia Assessment & Plan: Not on any medications. Patient open to starting on statin if necessary. Will check fasting lipid panel. Encouraged to continue healthy diet and exercise.   Orders: -     Lipid panel; Future  Anxiety Assessment & Plan: Chronic. Stable without medication. Symptoms well managed at this time. GAD-9. Encouraged to contact if symptoms are worsening or changing. Will monitor.    Thyroid disorder screen -     TSH; Future  Need for Tdap vaccination -     Tdap vaccine greater than or equal to 7yo  IM    Return in about 1 year (around 07/07/2023) for Annual Exam, sooner PRN.Marland Kitchen   Bethanie Dicker, NP-C [facility] [employer]

## 2022-07-07 NOTE — Assessment & Plan Note (Signed)
Not on any medications. Patient open to starting on statin if necessary. Will check fasting lipid panel. Encouraged to continue healthy diet and exercise.

## 2022-07-15 ENCOUNTER — Other Ambulatory Visit: Payer: Self-pay | Admitting: Nurse Practitioner

## 2022-07-15 ENCOUNTER — Other Ambulatory Visit (INDEPENDENT_AMBULATORY_CARE_PROVIDER_SITE_OTHER): Payer: BC Managed Care – PPO

## 2022-07-15 DIAGNOSIS — E7801 Familial hypercholesterolemia: Secondary | ICD-10-CM

## 2022-07-15 DIAGNOSIS — Z0000 Encounter for general adult medical examination without abnormal findings: Secondary | ICD-10-CM

## 2022-07-15 DIAGNOSIS — Z1329 Encounter for screening for other suspected endocrine disorder: Secondary | ICD-10-CM | POA: Diagnosis not present

## 2022-07-15 LAB — COMPREHENSIVE METABOLIC PANEL
ALT: 33 U/L (ref 0–53)
AST: 32 U/L (ref 0–37)
Albumin: 4.6 g/dL (ref 3.5–5.2)
Alkaline Phosphatase: 47 U/L (ref 39–117)
BUN: 17 mg/dL (ref 6–23)
CO2: 28 mEq/L (ref 19–32)
Calcium: 9.7 mg/dL (ref 8.4–10.5)
Chloride: 103 mEq/L (ref 96–112)
Creatinine, Ser: 1.38 mg/dL (ref 0.40–1.50)
GFR: 68.45 mL/min (ref >60.00)
Glucose, Bld: 76 mg/dL (ref 70–99)
Potassium: 4.1 mEq/L (ref 3.5–5.1)
Sodium: 140 mEq/L (ref 135–145)
Total Bilirubin: 0.7 mg/dL (ref 0.2–1.2)
Total Protein: 7 g/dL (ref 6.0–8.3)

## 2022-07-15 LAB — LIPID PANEL
Cholesterol: 225 mg/dL — ABNORMAL HIGH (ref 0–200)
HDL: 39.5 mg/dL (ref >39.00)
LDL Cholesterol: 163 mg/dL — ABNORMAL HIGH (ref 0–99)
NonHDL: 185.19
Total CHOL/HDL Ratio: 6
Triglycerides: 110 mg/dL (ref 0.0–149.0)
VLDL: 22 mg/dL (ref 0.0–40.0)

## 2022-07-15 LAB — CBC WITH DIFFERENTIAL/PLATELET
Basophils Absolute: 0.1 10*3/uL (ref 0.0–0.1)
Basophils Relative: 1 % (ref 0.0–3.0)
Eosinophils Absolute: 0.3 10*3/uL (ref 0.0–0.7)
Eosinophils Relative: 3.4 % (ref 0.0–5.0)
HCT: 47.1 % (ref 39.0–52.0)
Hemoglobin: 15.9 g/dL (ref 13.0–17.0)
Lymphocytes Relative: 34.4 % (ref 12.0–46.0)
Lymphs Abs: 2.7 10*3/uL (ref 0.7–4.0)
MCHC: 33.8 g/dL (ref 30.0–36.0)
MCV: 88.4 fl (ref 78.0–100.0)
Monocytes Absolute: 0.6 10*3/uL (ref 0.1–1.0)
Monocytes Relative: 7.7 % (ref 3.0–12.0)
Neutro Abs: 4.1 10*3/uL (ref 1.4–7.7)
Neutrophils Relative %: 53.5 % (ref 43.0–77.0)
Platelets: 192 10*3/uL (ref 150.0–400.0)
RBC: 5.33 Mil/uL (ref 4.22–5.81)
RDW: 13.3 % (ref 11.5–15.5)
WBC: 7.7 10*3/uL (ref 4.0–10.5)

## 2022-07-15 LAB — TSH: TSH: 1.48 u[IU]/mL (ref 0.35–5.50)

## 2022-07-15 LAB — HEMOGLOBIN A1C: Hgb A1c MFr Bld: 5.4 % (ref 4.6–6.5)

## 2022-07-15 LAB — VITAMIN D 25 HYDROXY (VIT D DEFICIENCY, FRACTURES): VITD: 38.16 ng/mL (ref 30.00–100.00)

## 2022-07-15 LAB — VITAMIN B12: Vitamin B-12: 726 pg/mL (ref 211–911)

## 2022-07-15 MED ORDER — CRESTOR 10 MG PO TABS
10.0000 mg | ORAL_TABLET | Freq: Every day | ORAL | 3 refills | Status: DC
Start: 2022-07-15 — End: 2022-07-20

## 2022-07-20 ENCOUNTER — Encounter: Payer: Self-pay | Admitting: Nurse Practitioner

## 2022-07-20 MED ORDER — ROSUVASTATIN CALCIUM 10 MG PO TABS
10.0000 mg | ORAL_TABLET | Freq: Every day | ORAL | 3 refills | Status: DC
Start: 2022-07-20 — End: 2023-08-17

## 2022-10-06 DIAGNOSIS — M25511 Pain in right shoulder: Secondary | ICD-10-CM | POA: Diagnosis not present

## 2022-10-06 DIAGNOSIS — R2230 Localized swelling, mass and lump, unspecified upper limb: Secondary | ICD-10-CM | POA: Diagnosis not present

## 2022-10-21 DIAGNOSIS — M79601 Pain in right arm: Secondary | ICD-10-CM | POA: Diagnosis not present

## 2022-10-21 DIAGNOSIS — M25511 Pain in right shoulder: Secondary | ICD-10-CM | POA: Diagnosis not present

## 2022-10-28 ENCOUNTER — Ambulatory Visit (INDEPENDENT_AMBULATORY_CARE_PROVIDER_SITE_OTHER): Payer: BC Managed Care – PPO | Admitting: Nurse Practitioner

## 2022-10-28 ENCOUNTER — Encounter: Payer: Self-pay | Admitting: Nurse Practitioner

## 2022-10-28 VITALS — BP 124/86 | HR 54 | Temp 97.9°F | Ht 74.0 in | Wt 268.4 lb

## 2022-10-28 DIAGNOSIS — E7801 Familial hypercholesterolemia: Secondary | ICD-10-CM

## 2022-10-28 DIAGNOSIS — M24811 Other specific joint derangements of right shoulder, not elsewhere classified: Secondary | ICD-10-CM | POA: Diagnosis not present

## 2022-10-28 DIAGNOSIS — F411 Generalized anxiety disorder: Secondary | ICD-10-CM

## 2022-10-28 MED ORDER — BUSPIRONE HCL 7.5 MG PO TABS
7.5000 mg | ORAL_TABLET | Freq: Two times a day (BID) | ORAL | 1 refills | Status: DC
Start: 2022-10-28 — End: 2023-04-24

## 2022-10-28 NOTE — Assessment & Plan Note (Signed)
Chronic. Started on Crestor 10 mg daily in May. Will recheck lipid panel. Encouraged to continue healthy diet and exercise.

## 2022-10-28 NOTE — Assessment & Plan Note (Signed)
Chronic issue. Worsening symptoms. Will start patient on Buspirone 7.5 mg BID. Counseled patient on common side effects. He will follow up in one month, sooner PRN. Advised to contact if worsening or changing symptoms. Will continue to monitor.

## 2022-10-28 NOTE — Assessment & Plan Note (Signed)
MRI last week. Encouraged to follow up with [facility] on Tuesday as scheduled.

## 2022-10-28 NOTE — Progress Notes (Signed)
Bethanie Dicker, NP-C Phone: [phone]  Alex Rivas is a 32 y.o. male who presents today for follow up.   Anxiety- Increased anxiety recently. Having more panic attacks, racing thoughts and feeling like his hart is racing. He has been on several different medications in the past. He has been taking his wife's Buspirone recently as needed for his symptoms. He does feel that it has been helpful. He is not interested in a SSRI.   HYPERLIPIDEMIA Symptoms Chest pain on exertion:  No   Leg claudication:   No Medications: Compliance- Crestor  Right upper quadrant pain- No  Muscle aches- No Lipid Panel     Component Value Date/Time   CHOL 225 (H) 07/15/2022 1002   TRIG 110.0 07/15/2022 1002   HDL 39.50 07/15/2022 1002   CHOLHDL 6 07/15/2022 1002   VLDL 22.0 07/15/2022 1002   LDLCALC 163 (H) 07/15/2022 1002   Right Shoulder- Recently evaluated at [facility] for right shoulder problems. He had a MRI last Friday. He has a follow up scheduled for Tuesday to review the results. He was being evaluated for a possible rotator cuff and SLAP tear. He would like to try physical therapy prior to surgical intervention. He continues to work out and [employer].  Social History   Tobacco Use  Smoking Status Former   Types: Cigarettes  Smokeless Tobacco Current   Types: Chew    Current Outpatient Medications on File Prior to Visit  Medication Sig Dispense Refill   rosuvastatin (CRESTOR) 10 MG tablet Take 1 tablet (10 mg total) by mouth daily. 90 tablet 3   No current facility-administered medications on file prior to visit.    ROS see history of present illness  Objective  Physical Exam Vitals:   10/28/22 1134  BP: 124/86  Pulse: (!) 54  Temp: 97.9 F (36.6 C)  SpO2: 99%    BP Readings from Last 3 Encounters:  10/28/22 124/86  07/07/22 131/85  05/19/12 126/67   Wt Readings from Last 3 Encounters:  10/28/22 268 lb 6.4 oz (121.7 kg)  07/07/22 277 lb (125.6 kg)  11/21/11 272  lb 3.2 oz (123.5 kg)    Physical Exam Constitutional:      General: He is not in acute distress.    Appearance: Normal appearance.  HENT:     Head: Normocephalic.  Cardiovascular:     Rate and Rhythm: Normal rate and regular rhythm.     Heart sounds: Normal heart sounds.  Pulmonary:     Effort: Pulmonary effort is normal.     Breath sounds: Normal breath sounds.  Skin:    General: Skin is warm and dry.  Neurological:     General: No focal deficit present.     Mental Status: He is alert.  Psychiatric:        Mood and Affect: Mood normal.        Behavior: Behavior normal.    Assessment/Plan: Please see individual problem list.  Generalized anxiety disorder Assessment & Plan: Chronic issue. Worsening symptoms. Will start patient on Buspirone 7.5 mg BID. Counseled patient on common side effects. He will follow up in one month, sooner PRN. Advised to contact if worsening or changing symptoms. Will continue to monitor.   Orders: -     busPIRone HCl; Take 1 tablet (7.5 mg total) by mouth 2 (two) times daily.  Dispense: 180 tablet; Refill: 1  Familial hypercholesterolemia Assessment & Plan: Chronic. Started on Crestor 10 mg daily in May. Will recheck lipid panel.  Encouraged to continue healthy diet and exercise.   Orders: -     Comprehensive metabolic panel; Future -     Lipid panel; Future  Internal derangement of right shoulder Assessment & Plan: MRI last week. Encouraged to follow up with [facility] on Tuesday as scheduled.     Return in about 4 weeks (around 11/25/2022) for Anxiety/Depression.   Bethanie Dicker, NP-C [facility] - [employer]

## 2022-11-01 DIAGNOSIS — S43431A Superior glenoid labrum lesion of right shoulder, initial encounter: Secondary | ICD-10-CM | POA: Diagnosis not present

## 2022-11-01 DIAGNOSIS — D1601 Benign neoplasm of scapula and long bones of right upper limb: Secondary | ICD-10-CM | POA: Diagnosis not present

## 2022-11-11 ENCOUNTER — Other Ambulatory Visit (INDEPENDENT_AMBULATORY_CARE_PROVIDER_SITE_OTHER): Payer: BC Managed Care – PPO

## 2022-11-11 ENCOUNTER — Encounter: Payer: Self-pay | Admitting: Nurse Practitioner

## 2022-11-11 DIAGNOSIS — E7801 Familial hypercholesterolemia: Secondary | ICD-10-CM | POA: Diagnosis not present

## 2022-11-11 LAB — LIPID PANEL
Cholesterol: 154 mg/dL (ref 0–200)
HDL: 40.5 mg/dL (ref >39.00)
LDL Cholesterol: 92 mg/dL (ref 0–99)
NonHDL: 113.88
Total CHOL/HDL Ratio: 4
Triglycerides: 111 mg/dL (ref 0.0–149.0)
VLDL: 22.2 mg/dL (ref 0.0–40.0)

## 2022-11-11 LAB — COMPREHENSIVE METABOLIC PANEL
ALT: 32 U/L (ref 0–53)
AST: 31 U/L (ref 0–37)
Albumin: 4.6 g/dL (ref 3.5–5.2)
Alkaline Phosphatase: 41 U/L (ref 39–117)
BUN: 21 mg/dL (ref 6–23)
CO2: 28 meq/L (ref 19–32)
Calcium: 9.9 mg/dL (ref 8.4–10.5)
Chloride: 102 meq/L (ref 96–112)
Creatinine, Ser: 1.22 mg/dL (ref 0.40–1.50)
GFR: 79.18 mL/min (ref >60.00)
Glucose, Bld: 82 mg/dL (ref 70–99)
Potassium: 4 meq/L (ref 3.5–5.1)
Sodium: 138 meq/L (ref 135–145)
Total Bilirubin: 0.7 mg/dL (ref 0.2–1.2)
Total Protein: 7.4 g/dL (ref 6.0–8.3)

## 2022-11-25 ENCOUNTER — Ambulatory Visit: Payer: BC Managed Care – PPO | Admitting: Nurse Practitioner

## 2022-12-10 ENCOUNTER — Emergency Department (HOSPITAL_BASED_OUTPATIENT_CLINIC_OR_DEPARTMENT_OTHER): Payer: BC Managed Care – PPO

## 2022-12-10 ENCOUNTER — Emergency Department (HOSPITAL_BASED_OUTPATIENT_CLINIC_OR_DEPARTMENT_OTHER)
Admission: EM | Admit: 2022-12-10 | Discharge: 2022-12-10 | Disposition: A | Discharge status: Home / Self Care | Payer: BC Managed Care – PPO

## 2022-12-10 ENCOUNTER — Encounter (HOSPITAL_BASED_OUTPATIENT_CLINIC_OR_DEPARTMENT_OTHER): Payer: Self-pay

## 2022-12-10 ENCOUNTER — Other Ambulatory Visit: Payer: Self-pay

## 2022-12-10 DIAGNOSIS — S99911A Unspecified injury of right ankle, initial encounter: Secondary | ICD-10-CM | POA: Diagnosis not present

## 2022-12-10 DIAGNOSIS — Y92007 Garden or yard of unspecified non-institutional (private) residence as the place of occurrence of the external cause: Secondary | ICD-10-CM | POA: Insufficient documentation

## 2022-12-10 DIAGNOSIS — M79671 Pain in right foot: Secondary | ICD-10-CM | POA: Insufficient documentation

## 2022-12-10 DIAGNOSIS — W19XXXA Unspecified fall, initial encounter: Secondary | ICD-10-CM | POA: Diagnosis not present

## 2022-12-10 DIAGNOSIS — M25571 Pain in right ankle and joints of right foot: Secondary | ICD-10-CM | POA: Diagnosis not present

## 2022-12-10 DIAGNOSIS — S90511A Abrasion, right ankle, initial encounter: Secondary | ICD-10-CM | POA: Insufficient documentation

## 2022-12-10 NOTE — Discharge Instructions (Signed)
You may take over-the-counter Tylenol alternating with Motrin for pain.  As discussed, please follow-up with your MyChart results for x-ray. You may need splinting/bracing if radiology was to find fracture.  Also, if there is a fracture, you will need to follow-up with Ortho in 1 week.  Otherwise, follow-up with Ortho as needed.

## 2022-12-10 NOTE — ED Provider Notes (Signed)
[facility] AT [facility] Provider Note   CSN: [id number] Arrival date & time: 12/10/22  1104     History  Chief Complaint  Patient presents with   Ankle Injury    Alex Rivas is a 32 y.o. male.  32 year old male present emergency department with right ankle/foot pain after doing yard work with his father and tree limb fell striking his ankle foot.  Has been able to walk on a, but has progressively gotten more stiff and painful.  Wife wanted him to get checked out.  No numbness tingling changes in sensation.   Ankle Injury       Home Medications Prior to Admission medications   Medication Sig Start Date End Date Taking? Authorizing Provider  busPIRone (BUSPAR) 7.5 MG tablet Take 1 tablet (7.5 mg total) by mouth 2 (two) times daily. 10/28/22   Bethanie Dicker, NP  rosuvastatin (CRESTOR) 10 MG tablet Take 1 tablet (10 mg total) by mouth daily. 07/20/22   Bethanie Dicker, NP      Allergies    Patient has no known allergies.    Review of Systems   Review of Systems  Physical Exam Updated Vital Signs BP 136/87 (BP Location: Right Arm)   Pulse (!) 54   Temp 98 F (36.7 C) (Oral)   Resp 15   Ht 6\' 2"  (1.88 m)   Wt 118.8 kg   SpO2 100%   BMI 33.64 kg/m  Physical Exam Cardiovascular:     Rate and Rhythm: Normal rate.  Musculoskeletal:     Comments: Superficial abrasion to the lateral aspect of the right malleolus.  5 out of 5 plantarflexion dorsiflexion.  Normal sensation.  2+ DP pulse.  No joint instability.  Some minor tenderness to the proximal dorsal aspect of foot  Skin:    General: Skin is warm and dry.  Neurological:     Mental Status: He is oriented to person, place, and time.  Psychiatric:        Mood and Affect: Mood normal.        Behavior: Behavior normal.     ED Results / Procedures / Treatments   Labs (all labs ordered are listed, but only abnormal results are displayed) Labs Reviewed - No data to  display  EKG None  Radiology No results found.  Procedures Procedures    Medications Ordered in ED Medications - No data to display  ED Course/ Medical Decision Making/ A&P                                 Medical Decision Making Well-appearing 32 year old male present emergency department for ankle pain.  Afebrile vital signs reassuring.  Physical exam was superficial abrasion, and some minor tenderness to the dorsal of the foot.  X-rays independently reviewed by myself with no overt fracture.  Patient and father wanted to leave prior to radiology's final read.  Shared decision making regarding leaving.  They stated they have MyChart and will follow results, but states his ankle is not hurting very badly currently.  He is ambulatory in the department.  They will follow-up with Ortho as needed.  Amount and/or Complexity of Data Reviewed Radiology: ordered.          Final Clinical Impression(s) / ED Diagnoses Final diagnoses:  None    Rx / DC Orders ED Discharge Orders     None  Coral Spikes, DO 12/10/22 1248

## 2022-12-10 NOTE — ED Triage Notes (Signed)
Pt presents with a R foot and ankle injury after a limb fell on it. Pt ambulatory with even, steady gait into triage.

## 2023-01-16 ENCOUNTER — Encounter: Payer: Self-pay | Admitting: Nurse Practitioner

## 2023-01-17 ENCOUNTER — Other Ambulatory Visit: Payer: Self-pay | Admitting: Nurse Practitioner

## 2023-01-17 DIAGNOSIS — F411 Generalized anxiety disorder: Secondary | ICD-10-CM

## 2023-01-31 ENCOUNTER — Ambulatory Visit: Payer: BC Managed Care – PPO | Admitting: Nurse Practitioner

## 2023-04-22 ENCOUNTER — Other Ambulatory Visit: Payer: Self-pay | Admitting: Nurse Practitioner

## 2023-04-22 DIAGNOSIS — F411 Generalized anxiety disorder: Secondary | ICD-10-CM

## 2023-06-10 ENCOUNTER — Encounter: Payer: Self-pay | Admitting: Nurse Practitioner

## 2023-07-13 ENCOUNTER — Telehealth: Payer: Self-pay

## 2023-07-13 NOTE — Telephone Encounter (Signed)
 Copied from CRM [id number]. Topic: Appointments - Scheduling Inquiry for Clinic >> Jul 13, 2023  8:16 AM Alex Rivas wrote: Reason for CRM: Patient called in wanting to reschedule his physical that is supposed to happen tomorrow 5/9. Agent could not get Alex Shack, Alex Rivas schedule to populate with different dates/times for the patient. Callback number [phone] to reschedule.  I called patient and left a voicemail asking him to call us  back.  I did cancel patient's appointment for tomorrow.  E2C2 - when patient calls back, if you are unable to reschedule his 07/14/2023 appointment with Alex Burkitt, Alex Rivas, then please connect patient with our office.

## 2023-07-14 ENCOUNTER — Encounter: Payer: BC Managed Care – PPO | Admitting: Nurse Practitioner

## 2023-08-17 ENCOUNTER — Encounter: Payer: Self-pay | Admitting: Nurse Practitioner

## 2023-08-17 ENCOUNTER — Ambulatory Visit: Admitting: Nurse Practitioner

## 2023-08-17 DIAGNOSIS — E7801 Familial hypercholesterolemia: Secondary | ICD-10-CM

## 2023-08-17 DIAGNOSIS — F411 Generalized anxiety disorder: Secondary | ICD-10-CM | POA: Diagnosis not present

## 2023-08-17 NOTE — Progress Notes (Unsigned)
 Bluford Burkitt, NP-C Phone: [phone]  Alex Rivas is a 33 y.o. male who presents today for follow up.   Discussed the use of AI scribe software for clinical note transcription with the patient, who gave verbal consent to proceed.  History of Present Illness   The patient, with high LDL cholesterol and anxiety, presents with concerns about cholesterol management and anxiety.  They discontinued statin therapy due to muscle strains and spasms, which occurred shortly after starting the medication. Attempts to take the statin every other day were unsuccessful, as muscle issues persisted, including back and tricep spasms, leading to a partial tear in their arm. Symptoms subsided after stopping the statin.  They have been exercising more, including running and functional training, and have gained weight, currently around 272-280 pounds. They have started taking creatine again, mixed with blueberry juice, and are considering fish oil supplements. They are interested in monitoring their cholesterol and kidney function, especially since they have been taking creatine. They mention a previous incident where creatine use led to a misdiagnosis of stage 3 kidney failure due to dehydration.  They experienced a panic attack on Monday before lunch, prompting an increase in their Buspar  dosage to three times a day to manage anxiety. They have a history of ADHD and a family history of depression and anxiety. Summer is particularly challenging for their anxiety due to a lack of structure. They have been using techniques to manage anxiety and find the increased Buspar  dosage helpful. No chest pain or shortness of breath, except during panic episodes, and heat can trigger their anxiety.  Social History   Tobacco Use  Smoking Status Former   Types: Cigarettes  Smokeless Tobacco Current   Types: Chew    No current outpatient medications on file prior to visit.   No current facility-administered medications  on file prior to visit.     ROS see history of present illness  Objective  Physical Exam Vitals:   08/17/23 1510  BP: 138/86  Pulse: 69  Temp: 97.8 F (36.6 C)  SpO2: 96%    BP Readings from Last 3 Encounters:  08/17/23 138/86  12/10/22 136/87  10/28/22 124/86   Wt Readings from Last 3 Encounters:  08/17/23 275 lb 9.6 oz (125 kg)  12/10/22 262 lb (118.8 kg)  10/28/22 268 lb 6.4 oz (121.7 kg)    Physical Exam Constitutional:      General: He is not in acute distress.    Appearance: Normal appearance.  HENT:     Head: Normocephalic.   Cardiovascular:     Rate and Rhythm: Normal rate and regular rhythm.     Heart sounds: Normal heart sounds.  Pulmonary:     Effort: Pulmonary effort is normal.     Breath sounds: Normal breath sounds.   Skin:    General: Skin is warm and dry.   Neurological:     General: No focal deficit present.     Mental Status: He is alert.   Psychiatric:        Mood and Affect: Mood normal.        Behavior: Behavior normal.      Assessment/Plan: Please see individual problem list.  Generalized anxiety disorder Assessment & Plan: Anxiety has increased during the summer, with a history of ADHD and family history of depression and anxiety. Buspar  is effective, and there are concerns about SSRIs. Emphasize non-pharmacological strategies. Continue Buspar  7.5 mg three times a day. Monitor anxiety symptoms and adjust medication as needed.  Encourage non-pharmacological strategies.  Orders: -     busPIRone  HCl; Take 1 tablet (7.5 mg total) by mouth 3 (three) times daily.  Dispense: 270 tablet; Refill: 3  Familial hypercholesterolemia Assessment & Plan: LDL levels previously elevated at 163 mg/dL then decreased to 92 when on Crestor , with a genetic predisposition. Previous statin use caused muscle issues. Repatha is considered but not necessary currently. Emphasize lifestyle modifications. Consider fish oil supplements and discuss Repatha as  a future option if necessary. Schedule fasting labs to recheck cholesterol levels.      Return for as needed.   Bluford Burkitt, NP-C [facility] - [facility]

## 2023-08-25 MED ORDER — BUSPIRONE HCL 7.5 MG PO TABS
7.5000 mg | ORAL_TABLET | Freq: Two times a day (BID) | ORAL | 1 refills | Status: DC
Start: 2023-08-25 — End: 2023-08-25

## 2023-08-25 MED ORDER — BUSPIRONE HCL 7.5 MG PO TABS: 7.5000 mg | ORAL_TABLET | Freq: Three times a day (TID) | ORAL | 3 refills | Status: AC

## 2023-08-25 MED ORDER — ROSUVASTATIN CALCIUM 10 MG PO TABS
10.0000 mg | ORAL_TABLET | Freq: Every day | ORAL | 3 refills | Status: DC
Start: 2023-08-25 — End: 2023-08-25

## 2023-08-25 NOTE — Assessment & Plan Note (Signed)
 Anxiety has increased during the summer, with a history of ADHD and family history of depression and anxiety. Buspar  is effective, and there are concerns about SSRIs. Emphasize non-pharmacological strategies. Continue Buspar  7.5 mg three times a day. Monitor anxiety symptoms and adjust medication as needed. Encourage non-pharmacological strategies.

## 2023-08-25 NOTE — Assessment & Plan Note (Signed)
 LDL levels previously elevated at 163 mg/dL then decreased to 92 when on Crestor , with a genetic predisposition. Previous statin use caused muscle issues. Repatha is considered but not necessary currently. Emphasize lifestyle modifications. Consider fish oil supplements and discuss Repatha as a future option if necessary. Schedule fasting labs to recheck cholesterol levels.

## 2023-08-28 ENCOUNTER — Encounter: Payer: Self-pay | Admitting: Nurse Practitioner

## 2023-09-07 ENCOUNTER — Ambulatory Visit
Admission: RE | Admit: 2023-09-07 | Discharge: 2023-09-07 | Disposition: A | Discharge status: Home / Self Care | Payer: Self-pay | Source: Ambulatory Visit | Attending: Emergency Medicine | Admitting: Emergency Medicine

## 2023-09-07 VITALS — BP 133/85 | HR 87 | Temp 97.7°F | Resp 18

## 2023-09-07 DIAGNOSIS — S0502XA Injury of conjunctiva and corneal abrasion without foreign body, left eye, initial encounter: Secondary | ICD-10-CM

## 2023-09-07 NOTE — Discharge Instructions (Addendum)
 Use the ofloxacin eyedrops as directed: 1-2 drops in the left eye 4 times a day for 7 days.    Follow-up with your eye care provider on Monday.    Go to the emergency department if you have acute eye pain, changes in your vision, or other concerning symptoms.

## 2023-09-07 NOTE — ED Provider Notes (Signed)
 [name]    CSN: [id number] Arrival date & time: 09/07/23  9147      History   Chief Complaint Chief Complaint  Patient presents with   Eye Problem    Eye has been red and irritated for past 24 hours. - Entered by patient    HPI Alex Rivas is a 33 y.o. male.  Patient presents with left eye redness and irritation x 2 days.  His symptoms started after he accidentally slept in his contact lenses.  He states it feels like the contact lens scratched his eye.  He denies eye drainage, change in vision, fever.  He has been using ofloxacin eyedrops which are leftover from a previous illness but are still in date; has used for 2 days.  Currently wearing glasses.  The history is provided by the patient and medical records.    Past Medical History:  Diagnosis Date   Anxiety 2021   No Medicine needed   Preventative health care 07/07/2022    Patient Active Problem List   Diagnosis Date Noted   Internal derangement of right shoulder 10/28/2022   Preventative health care 07/07/2022   Familial hypercholesterolemia 07/07/2022   Generalized anxiety disorder 07/07/2022    History reviewed. No pertinent surgical history.     Home Medications    Prior to Admission medications   Medication Sig Start Date End Date Taking? Authorizing Provider  busPIRone  (BUSPAR ) 7.5 MG tablet Take 1 tablet (7.5 mg total) by mouth 3 (three) times daily. 08/25/23  Yes Gretel App, NP    Family History Family History  Problem Relation Age of Onset   ADD / ADHD Mother    Depression Mother    Heart disease Paternal Grandfather     Social History Social History   Tobacco Use   Smoking status: Former    Types: Cigarettes   Smokeless tobacco: Current    Types: Chew  Vaping Use   Vaping status: Never Used  Substance Use Topics   Alcohol use: Yes    Alcohol/week: 1.0 standard drink of alcohol    Types: 1 Standard drinks or equivalent per week   Drug use: No     Allergies    Patient has no known allergies.   Review of Systems Review of Systems  Constitutional:  Negative for chills and fever.  Eyes:  Positive for pain and redness. Negative for discharge and visual disturbance.     Physical Exam Triage Vital Signs ED Triage Vitals [09/07/23 0900]  Encounter Vitals Group     BP 133/85     Girls Systolic BP Percentile      Girls Diastolic BP Percentile      Boys Systolic BP Percentile      Boys Diastolic BP Percentile      Pulse Rate 87     Resp 18     Temp 97.7 F (36.5 C)     Temp src      SpO2 97 %     Weight      Height      Head Circumference      Peak Flow      Pain Score      Pain Loc      Pain Education      Exclude from Growth Chart    No data found.  Updated Vital Signs BP 133/85   Pulse 87   Temp 97.7 F (36.5 C)   Resp 18   SpO2 97%   Visual  Acuity Right Eye Distance: 20/25 Left Eye Distance: 20/25 Bilateral Distance: 20/20 (wears glasses)  Right Eye Near:   Left Eye Near:    Bilateral Near:     Physical Exam Constitutional:      General: He is not in acute distress. HENT:     Mouth/Throat:     Mouth: Mucous membranes are moist.  Eyes:     General: Lids are normal. Vision grossly intact.        Right eye: No discharge.        Left eye: No discharge.     Extraocular Movements: Extraocular movements intact.     Conjunctiva/sclera:     Left eye: Left conjunctiva is injected.     Pupils: Pupils are equal, round, and reactive to light.     Left eye: Corneal abrasion and fluorescein uptake present.     Comments: Left eye has fluorescein uptake at 8 o'clock position and 3 o'clock position.  Cardiovascular:     Rate and Rhythm: Normal rate and regular rhythm.  Pulmonary:     Effort: Pulmonary effort is normal. No respiratory distress.  Neurological:     Mental Status: He is alert.      UC Treatments / Results  Labs (all labs ordered are listed, but only abnormal results are displayed) Labs Reviewed - No  data to display  EKG   Radiology No results found.  Procedures Procedures (including critical care time)  Medications Ordered in UC Medications - No data to display  Initial Impression / Assessment and Plan / UC Course  I have reviewed the triage vital signs and the nursing notes.  Pertinent labs & imaging results that were available during my care of the patient were reviewed by me and considered in my medical decision making (see chart for details).    Left corneal abrasion.  Patient's symptoms started after he slept with his contact lenses in.  He has been using ofloxacin eyedrops which are leftover but still in date.  Instructed him to continue these eyedrops for total of 7 days.  Instructed him to follow-up with his eye care provider on Monday.  ED precautions given.  Education provided on corneal abrasion.  Patient agrees to plan of care.  Final Clinical Impressions(s) / UC Diagnoses   Final diagnoses:  Abrasion of left cornea, initial encounter     Discharge Instructions      Use the ofloxacin eyedrops as directed: 1-2 drops in the left eye 4 times a day for 7 days.    Follow-up with your eye care provider on Monday.    Go to the emergency department if you have acute eye pain, changes in your vision, or other concerning symptoms.        ED Prescriptions   None    PDMP not reviewed this encounter.   Corlis Burnard DEL, NP 09/07/23 [phone]

## 2023-09-07 NOTE — ED Triage Notes (Signed)
 Pt states he is having pain/ irritation in his left eye x 2 days. Now having slight redness and swelling left eye. Tried left over antibiotic drops.

## 2023-10-30 ENCOUNTER — Other Ambulatory Visit

## 2023-11-20 ENCOUNTER — Encounter: Payer: Self-pay | Admitting: Nurse Practitioner

## 2023-12-08 ENCOUNTER — Other Ambulatory Visit

## 2023-12-08 DIAGNOSIS — E78019 Familial hypercholesterolemia, unspecified: Secondary | ICD-10-CM

## 2023-12-08 NOTE — Addendum Note (Signed)
 Addended by: MARYLEN PRO A on: 12/08/2023 08:43 AM   Modules accepted: Orders

## 2023-12-09 LAB — COMPREHENSIVE METABOLIC PANEL WITH GFR
ALT: 37 IU/L (ref 0–44)
AST: 36 IU/L (ref 0–40)
Albumin: 4.6 g/dL (ref 4.1–5.1)
Alkaline Phosphatase: 56 IU/L (ref 47–123)
BUN/Creatinine Ratio: 13 (ref 9–20)
BUN: 18 mg/dL (ref 6–20)
Bilirubin Total: 0.6 mg/dL (ref 0.0–1.2)
CO2: 23 mmol/L (ref 20–29)
Calcium: 9.5 mg/dL (ref 8.7–10.2)
Chloride: 100 mmol/L (ref 96–106)
Creatinine, Ser: 1.42 mg/dL — ABNORMAL HIGH (ref 0.76–1.27)
Globulin, Total: 2.4 g/dL (ref 1.5–4.5)
Glucose: 84 mg/dL (ref 70–99)
Potassium: 4.4 mmol/L (ref 3.5–5.2)
Sodium: 136 mmol/L (ref 134–144)
Total Protein: 7 g/dL (ref 6.0–8.5)
eGFR: 67 mL/min/1.73 (ref >59)

## 2023-12-09 LAB — LIPID PANEL
Chol/HDL Ratio: 5.5 ratio — ABNORMAL HIGH (ref 0.0–5.0)
Cholesterol, Total: 225 mg/dL — ABNORMAL HIGH (ref 100–199)
HDL: 41 mg/dL (ref >39)
LDL Chol Calc (NIH): 161 mg/dL — ABNORMAL HIGH (ref 0–99)
Triglycerides: 124 mg/dL (ref 0–149)
VLDL Cholesterol Cal: 23 mg/dL (ref 5–40)

## 2023-12-15 ENCOUNTER — Ambulatory Visit: Payer: Self-pay | Admitting: Nurse Practitioner
